# Patient Record
Sex: Male | Born: 1995 | Hispanic: Yes | Marital: Single | Smoking: Never smoker
Health system: Southern US, Community
[De-identification: ages and names within clinical notes are randomized; demographics above are authoritative.]

---

## 2018-05-25 ENCOUNTER — Encounter (HOSPITAL_COMMUNITY): Payer: Self-pay | Admitting: Emergency Medicine

## 2018-05-25 ENCOUNTER — Other Ambulatory Visit: Payer: Self-pay

## 2018-05-25 ENCOUNTER — Emergency Department (HOSPITAL_COMMUNITY)
Admission: EM | Admit: 2018-05-25 | Discharge: 2018-05-26 | Disposition: A | Discharge status: Home / Self Care | Payer: Medicaid Other | Attending: Emergency Medicine | Admitting: Emergency Medicine

## 2018-05-25 ENCOUNTER — Emergency Department (HOSPITAL_COMMUNITY): Payer: Medicaid Other

## 2018-05-25 DIAGNOSIS — M545 Low back pain: Secondary | ICD-10-CM | POA: Diagnosis not present

## 2018-05-25 DIAGNOSIS — H81399 Other peripheral vertigo, unspecified ear: Secondary | ICD-10-CM | POA: Diagnosis not present

## 2018-05-25 DIAGNOSIS — R42 Dizziness and giddiness: Secondary | ICD-10-CM | POA: Diagnosis present

## 2018-05-25 DIAGNOSIS — G8929 Other chronic pain: Secondary | ICD-10-CM

## 2018-05-25 LAB — URINALYSIS, ROUTINE W REFLEX MICROSCOPIC
Bilirubin Urine: NEGATIVE
Glucose, UA: NEGATIVE mg/dL
Hgb urine dipstick: NEGATIVE
Ketones, ur: NEGATIVE mg/dL
Leukocytes,Ua: NEGATIVE
Nitrite: NEGATIVE
Protein, ur: NEGATIVE mg/dL
Specific Gravity, Urine: 1.014 (ref 1.005–1.030)
pH: 7 (ref 5.0–8.0)

## 2018-05-25 MED ORDER — MECLIZINE HCL 12.5 MG PO TABS
25.0000 mg | ORAL_TABLET | Freq: Once | ORAL | Status: AC
  Administered 2018-05-25: 25 mg via ORAL
  Filled 2018-05-25: qty 2

## 2018-05-25 MED ORDER — MECLIZINE HCL 25 MG PO TABS: 25.0000 mg | ORAL_TABLET | Freq: Three times a day (TID) | ORAL | 0 refills | Status: AC | PRN

## 2018-05-25 NOTE — ED Triage Notes (Signed)
Headache since Thursday, dizzy  And back pain x 3 days.  Pain when starting to urinate.

## 2018-05-26 MED ORDER — IBUPROFEN 600 MG PO TABS: 600.0000 mg | ORAL_TABLET | Freq: Four times a day (QID) | ORAL | 0 refills | Status: AC | PRN

## 2018-05-26 NOTE — Discharge Instructions (Addendum)
You may take the medicine prescribed to help with your dizziness.  Refer to the instructions for the Epley maneuver which can help resolve this condition.  If your dizziness symptoms persist beyond the next week, I recommend calling an Ear, Nose and Throat specialist - in [place] that is Dr. Suszanne Conners.  The xray of your lower back is normal tonight.  You may benefit from an anti -inflammatory for back pain relief which you have been prescribed.

## 2018-05-27 NOTE — ED Provider Notes (Signed)
[facility] EMERGENCY DEPARTMENT Provider Note   CSN: [id number] Arrival date & time: 05/25/18  2059    History   Chief Complaint Chief Complaint  Patient presents with  . Dizziness    HPI Hector Yu is a 23 y.o. male presenting with several complaints, the primary being a symptom of dizziness, described as "the room spins", intermittently, and only when he stands up, not with other head positional changes for the past 5 days.  He denies fevers, chills, neck pain or stiffness, no n/v photophobia, vision changes or focal weakness but does endorse mild frontal headache. Appetite good.  Second complaint of acute on chronic low back pain, stating he has had back pain intermittently since he was hit in the lower back by a falling door at the age of 75.  He denies new injury but has had to sleep on the floor the past 3 nights to get relief.  He denies weakness or numbness in his extremities, no urinary retention or incontinence, no hematuria, no flank pain.  He reports chronic fleeting burning sensation at the start of his urine stream.  He denies penile dc or any risk for stds.   He has had no treatment for either of his complaints.     The history is provided by the patient. The history is limited by a language barrier. A language interpreter was used.    History reviewed. No pertinent past medical history.  There are no active problems to display for this patient.   History reviewed. No pertinent surgical history.      Home Medications    Prior to Admission medications   Medication Sig Start Date End Date Taking? Authorizing Provider  ibuprofen (ADVIL) 600 MG tablet Take 1 tablet (600 mg total) by mouth every 6 (six) hours as needed. 05/26/18   Burgess Amor, PA-C  meclizine (ANTIVERT) 25 MG tablet Take 1 tablet (25 mg total) by mouth 3 (three) times daily as needed for dizziness. 05/25/18   Burgess Amor, PA-C    Family History No family history on file.  Social History  Social History   Tobacco Use  . Smoking status: Never Smoker  . Smokeless tobacco: Never Used  Substance Use Topics  . Alcohol use: Not Currently  . Drug use: Never     Allergies   Patient has no known allergies.   Review of Systems Review of Systems  Constitutional: Negative.  Negative for chills and fever.  HENT: Negative.  Negative for congestion, ear discharge, ear pain, hearing loss, rhinorrhea and sinus pain.   Respiratory: Negative for shortness of breath.   Cardiovascular: Negative for chest pain and leg swelling.  Gastrointestinal: Negative for abdominal distention, abdominal pain, constipation and nausea.  Endocrine: Negative.   Genitourinary: Negative for difficulty urinating, dysuria, flank pain, frequency and urgency.  Musculoskeletal: Positive for back pain. Negative for gait problem, joint swelling, neck pain and neck stiffness.  Skin: Negative for rash.  Neurological: Positive for dizziness and headaches. Negative for weakness and numbness.     Physical Exam Updated Vital Signs BP 131/77 (BP Location: Left Arm)   Pulse 72   Temp 98.5 F (36.9 C) (Oral)   Resp 18   SpO2 100%   Physical Exam Vitals signs and nursing note reviewed.  Constitutional:      Appearance: He is well-developed.  HENT:     Head: Normocephalic and atraumatic.     Right Ear: Tympanic membrane and ear canal normal.     Left  Ear: Tympanic membrane and ear canal normal.     Nose: No congestion.  Eyes:     Conjunctiva/sclera: Conjunctivae normal.     Pupils: Pupils are equal, round, and reactive to light.  Neck:     Musculoskeletal: Normal range of motion and neck supple.  Cardiovascular:     Rate and Rhythm: Normal rate.     Heart sounds: Normal heart sounds.     Comments: Pedal pulses normal. Pulmonary:     Effort: Pulmonary effort is normal.  Abdominal:     General: Bowel sounds are normal. There is no distension.     Palpations: Abdomen is soft. There is no mass.      Tenderness: There is no abdominal tenderness.  Musculoskeletal: Normal range of motion.     Lumbar back: He exhibits tenderness. He exhibits no swelling, no edema and no spasm.     Comments: Midline lumbar ttp. No edema, no deformity.  No focal area of pain.  Lymphadenopathy:     Cervical: No cervical adenopathy.  Skin:    General: Skin is warm and dry.     Findings: No rash.  Neurological:     Mental Status: He is alert and oriented to person, place, and time.     GCS: GCS eye subscore is 4. GCS verbal subscore is 5. GCS motor subscore is 6.     Cranial Nerves: Cranial nerves are intact.     Sensory: Sensation is intact. No sensory deficit.     Motor: Motor function is intact. No tremor, atrophy or pronator drift.     Coordination: Coordination is intact.     Gait: Gait is intact. Gait normal.     Deep Tendon Reflexes:     Reflex Scores:      Patellar reflexes are 2+ on the right side and 2+ on the left side.      Achilles reflexes are 2+ on the right side and 2+ on the left side.    Comments: Normal heel-shin, normal rapid alternating movements. Cranial nerves III-XII intact.  No pronator drift.  Psychiatric:        Speech: Speech normal.        Behavior: Behavior normal.        Thought Content: Thought content normal.      ED Treatments / Results  Labs (all labs ordered are listed, but only abnormal results are displayed) Labs Reviewed  URINALYSIS, ROUTINE W REFLEX MICROSCOPIC  GC/CHLAMYDIA PROBE AMP ([place]) NOT AT [facility]    EKG None  Radiology Dg Lumbar Spine Complete  Result Date: 05/25/2018 CLINICAL DATA:  Back pain EXAM: LUMBAR SPINE - COMPLETE 4+ VIEW COMPARISON:  None. FINDINGS: There is no evidence of lumbar spine fracture. Alignment is normal. Intervertebral disc spaces are maintained. IMPRESSION: Negative. Electronically Signed   By: Charlett Nose M.D.   On: 05/25/2018 23:01    Procedures Procedures (including critical care time)  Medications Ordered in  ED Medications  meclizine (ANTIVERT) tablet 25 mg (25 mg Oral Given 05/25/18 2222)     Initial Impression / Assessment and Plan / ED Course  I have reviewed the triage vital signs and the nursing notes.  Pertinent labs & imaging results that were available during my care of the patient were reviewed by me and considered in my medical decision making (see chart for details).        Pt with sx suggesting peripheral vertigo, normal neuro exam and pt had moderate response to the meclizine  given here. No indication for advanced neuro imaging at this time.  Lumbar spine films negative. Given dysuria sx, gc/chlamydia cx added, pending. UA normal.  He was prescribed meclizine, encouraged anti inflammatories for low back pain prn.  Also encouraged obtaining pcp for ongoing care, referrals given.  Return precautions outlined.    The patient appears reasonably screened and/or stabilized for discharge and I doubt any other medical condition or other [facility] requiring further screening, evaluation, or treatment in the ED at this time prior to discharge.   Final Clinical Impressions(s) / ED Diagnoses   Final diagnoses:  Peripheral vertigo, unspecified laterality  Chronic midline low back pain without sciatica    ED Discharge Orders         Ordered    ibuprofen (ADVIL) 600 MG tablet  Every 6 hours PRN     05/26/18 0005    meclizine (ANTIVERT) 25 MG tablet  3 times daily PRN     05/25/18 2359           Burgess Amordol, Willeen Novak, PA-C 05/27/18 1336    Donnetta Hutchingook, Brian, MD 06/03/18 1510

## 2020-10-28 IMAGING — DX LUMBAR SPINE - COMPLETE 4+ VIEW
5 series · 5 of 5 positions shown · non-contrast
Comparison: None.

CLINICAL DATA: Back pain

EXAM:
LUMBAR SPINE - COMPLETE 4+ VIEW

[l-spine ap]
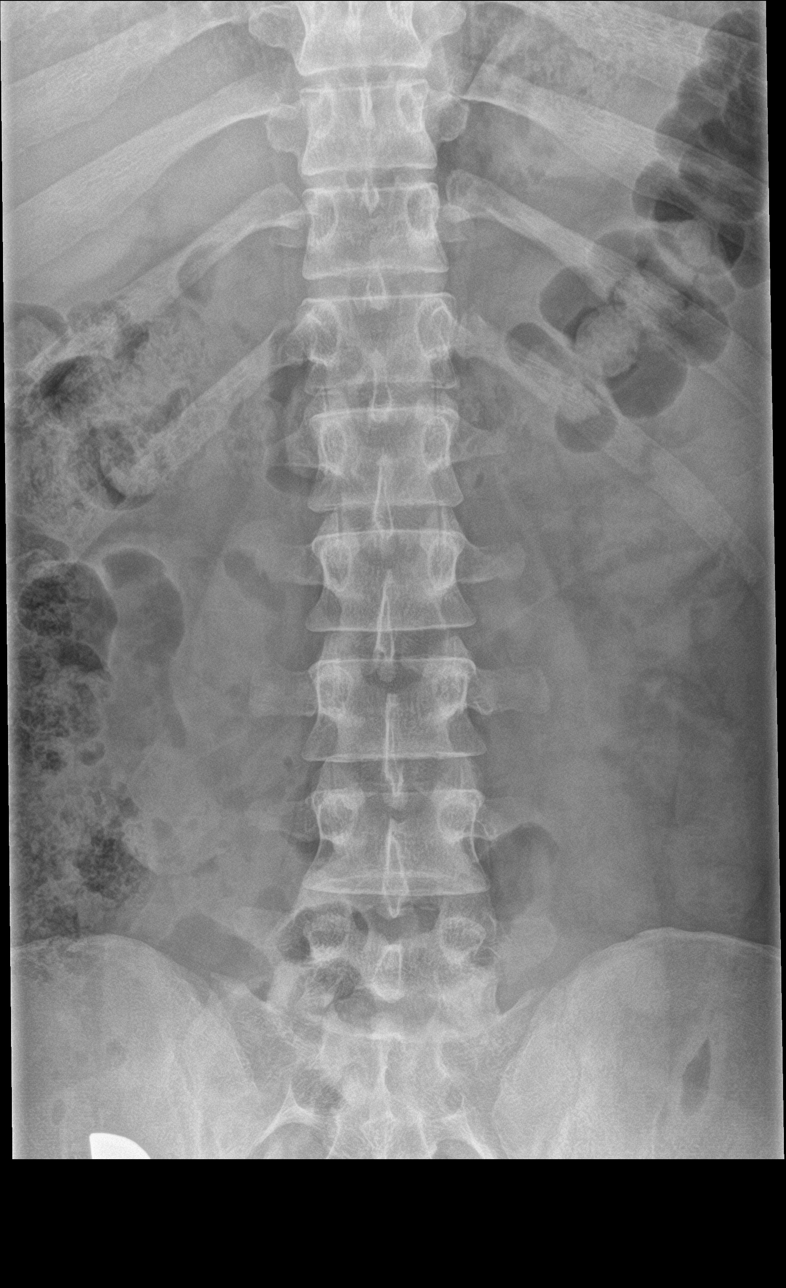

[l-spine obl (1 of 2)]
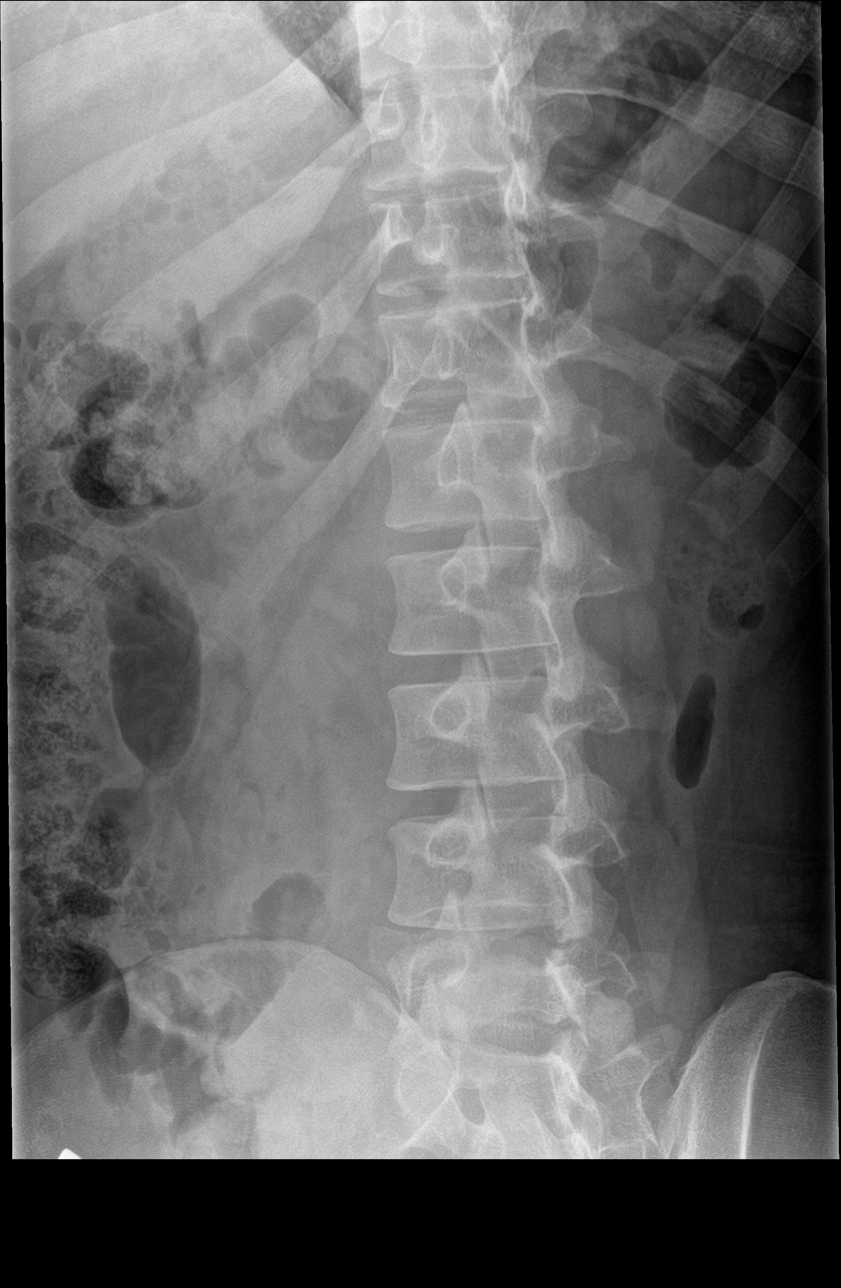

[l-spine obl (2 of 2)]
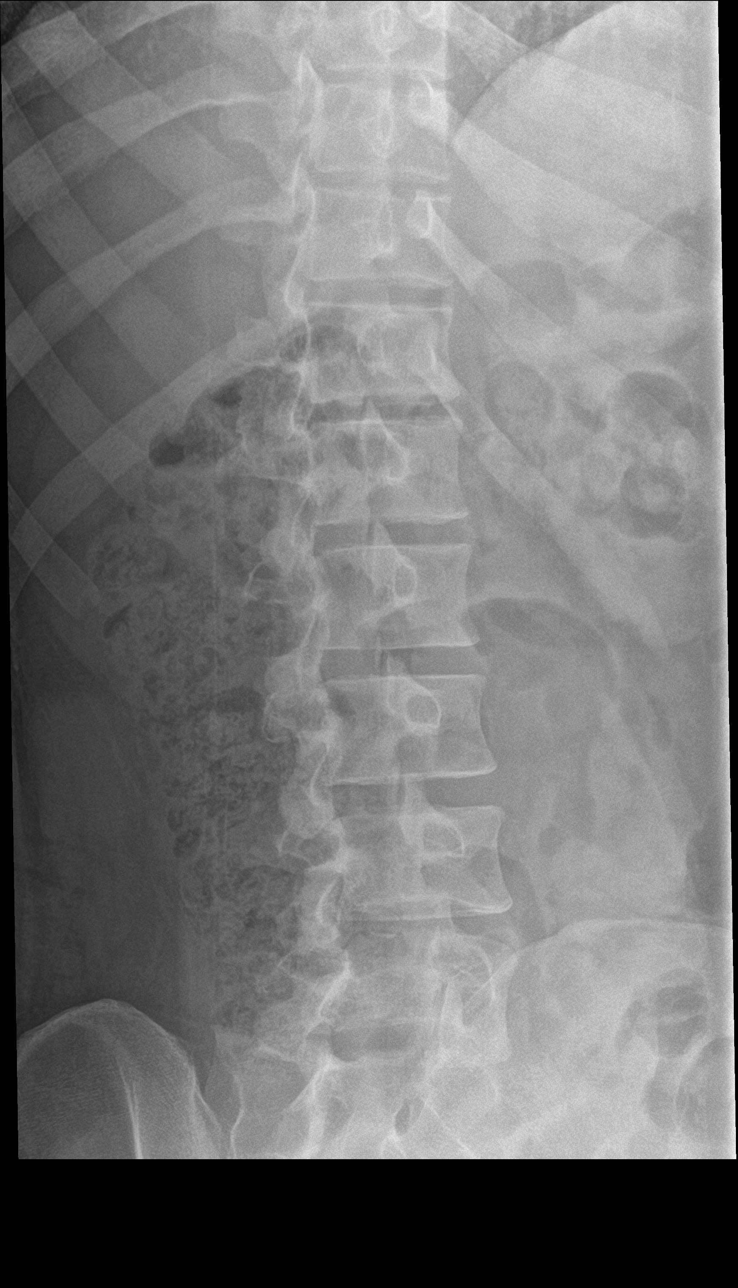

[l-spine lat]
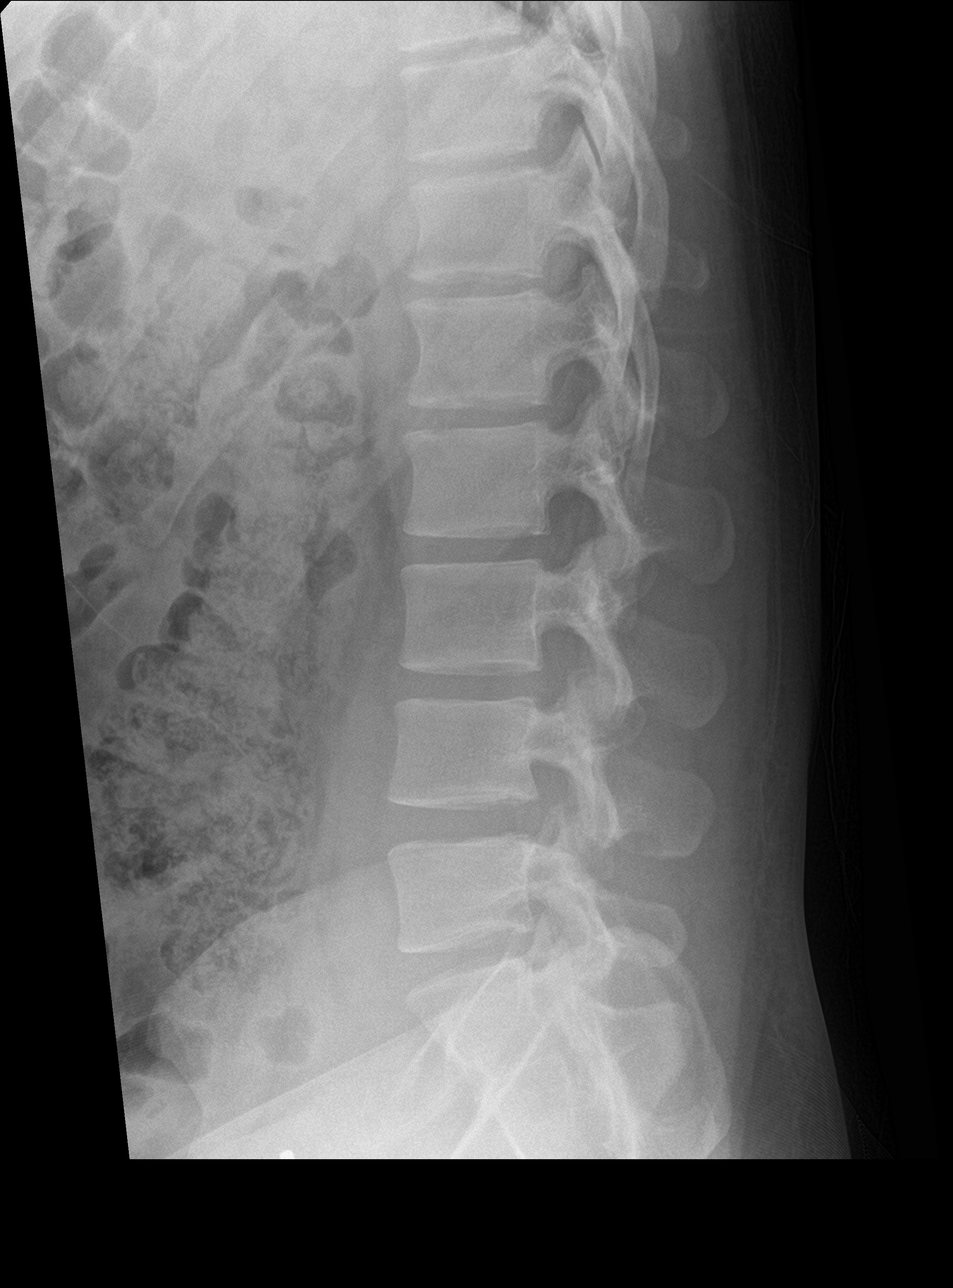

[l-spine spot]
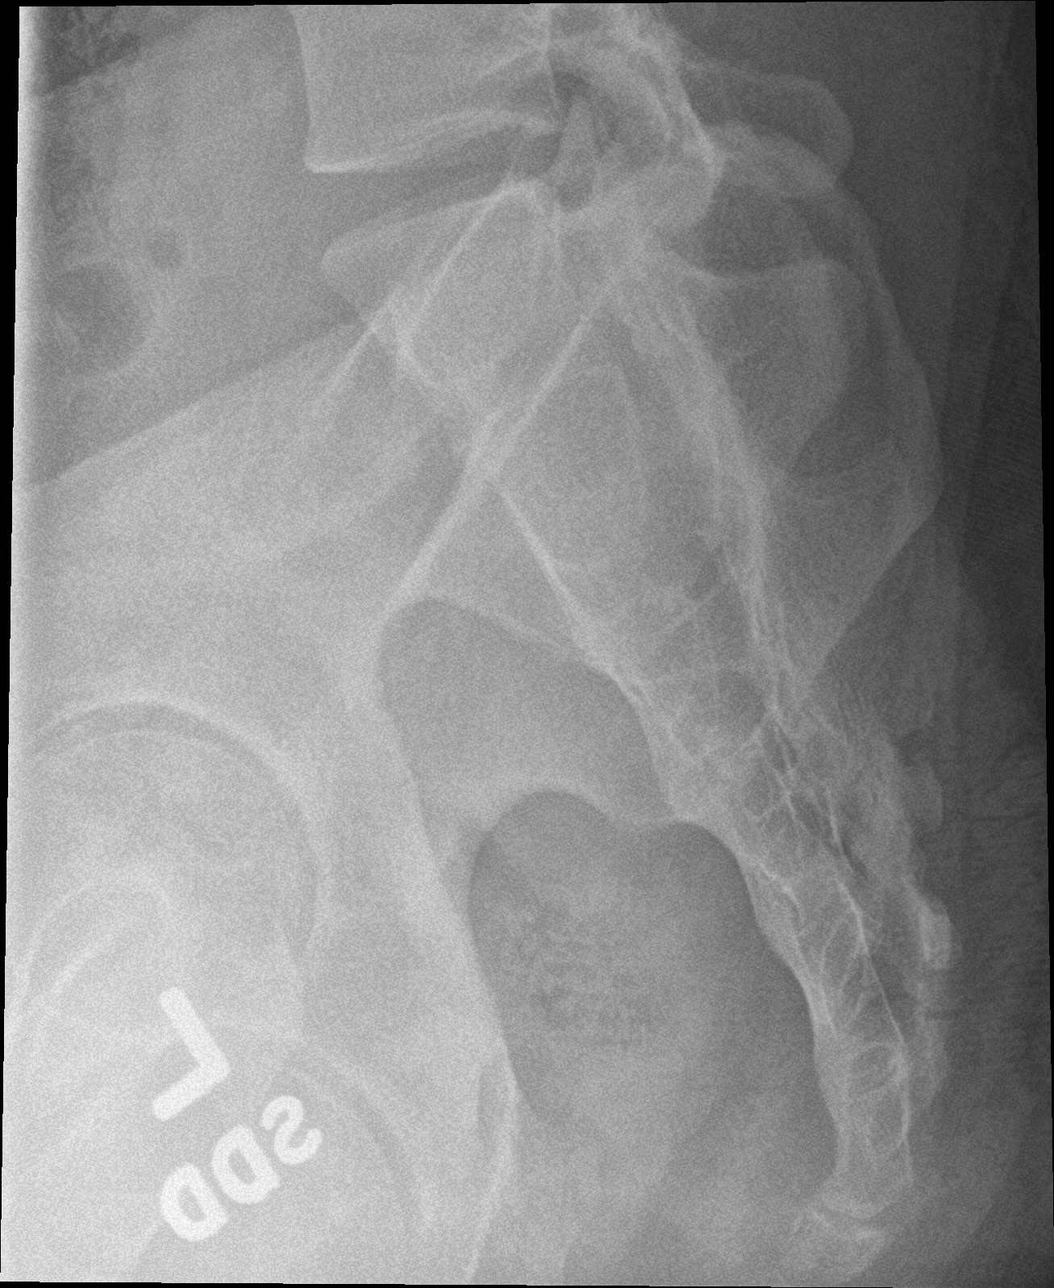

[5 of 5 positions shown; findings below may reference images not displayed]

FINDINGS: There is no evidence of lumbar spine fracture. Alignment is normal.
Intervertebral disc spaces are maintained.
IMPRESSION: Negative.

## 2023-05-18 ENCOUNTER — Other Ambulatory Visit: Payer: Self-pay

## 2023-05-18 ENCOUNTER — Emergency Department (HOSPITAL_COMMUNITY)
Admission: EM | Admit: 2023-05-18 | Discharge: 2023-05-18 | Disposition: A | Discharge status: Home / Self Care | Payer: Self-pay | Attending: Emergency Medicine | Admitting: Emergency Medicine

## 2023-05-18 DIAGNOSIS — R519 Headache, unspecified: Secondary | ICD-10-CM | POA: Insufficient documentation

## 2023-05-18 DIAGNOSIS — S0502XA Injury of conjunctiva and corneal abrasion without foreign body, left eye, initial encounter: Secondary | ICD-10-CM | POA: Insufficient documentation

## 2023-05-18 DIAGNOSIS — W228XXA Striking against or struck by other objects, initial encounter: Secondary | ICD-10-CM | POA: Insufficient documentation

## 2023-05-18 MED ORDER — ACETAMINOPHEN ER 650 MG PO TBCR: 650.0000 mg | EXTENDED_RELEASE_TABLET | Freq: Three times a day (TID) | ORAL | 0 refills | Status: AC | PRN

## 2023-05-18 MED ORDER — NAPROXEN 250 MG PO TABS
500.0000 mg | ORAL_TABLET | Freq: Once | ORAL | Status: AC
  Administered 2023-05-18: 500 mg via ORAL
  Filled 2023-05-18: qty 2

## 2023-05-18 MED ORDER — ERYTHROMYCIN 5 MG/GM OP OINT
TOPICAL_OINTMENT | Freq: Once | OPHTHALMIC | Status: AC
  Administered 2023-05-18: 1 via OPHTHALMIC
  Filled 2023-05-18: qty 3.5

## 2023-05-18 MED ORDER — NAPROXEN 500 MG PO TABS: 500.0000 mg | ORAL_TABLET | Freq: Two times a day (BID) | ORAL | 0 refills | Status: AC

## 2023-05-18 MED ORDER — FLUORESCEIN SODIUM 1 MG OP STRP
1.0000 | ORAL_STRIP | Freq: Once | OPHTHALMIC | Status: AC
  Administered 2023-05-18: 1 via OPHTHALMIC
  Filled 2023-05-18: qty 1

## 2023-05-18 MED ORDER — ACETAMINOPHEN 500 MG PO TABS
1000.0000 mg | ORAL_TABLET | Freq: Once | ORAL | Status: AC
  Administered 2023-05-18: 1000 mg via ORAL
  Filled 2023-05-18: qty 2

## 2023-05-18 MED ORDER — TETRACAINE HCL 0.5 % OP SOLN
2.0000 [drp] | Freq: Once | OPHTHALMIC | Status: AC
  Administered 2023-05-18: 2 [drp] via OPHTHALMIC
  Filled 2023-05-18: qty 4

## 2023-05-18 MED ORDER — ERYTHROMYCIN 5 MG/GM OP OINT: TOPICAL_OINTMENT | OPHTHALMIC | 0 refills | Status: AC

## 2023-05-18 NOTE — ED Provider Notes (Signed)
  [place] EMERGENCY DEPARTMENT AT [facility] Provider Note   CSN: [id number] Arrival date & time: 05/18/23  1027     History {Add pertinent medical, surgical, social history, OB history to HPI:1} Chief Complaint  Patient presents with   Foreign Body in Eye    Pecos Mimnaugh is a 28 y.o. male.  HPI     28 yo M comes in with cc of headache, eye discomfort, tearing, and scratching sensation.  Yday at 7 he was cleaning with a broom and accidentally struck the broom stick to his eye. He has some blurry vision. Headache - frontal, R side.   Home Medications Prior to Admission medications   Medication Sig Start Date End Date Taking? Authorizing Provider  ibuprofen  (ADVIL ) 600 MG tablet Take 1 tablet (600 mg total) by mouth every 6 (six) hours as needed. 05/26/18   Idol, Julie, PA-C  meclizine  (ANTIVERT ) 25 MG tablet Take 1 tablet (25 mg total) by mouth 3 (three) times daily as needed for dizziness. 05/25/18   Idol, Julie, PA-C      Allergies    Patient has no known allergies.    Review of Systems   Review of Systems  All other systems reviewed and are negative.   Physical Exam Updated Vital Signs BP 123/81   Pulse 80   Temp 98.3 F (36.8 C)   Resp 18   Ht 5\' 6"  (1.676 m)   Wt 99.8 kg   SpO2 97%   BMI 35.51 kg/m  Physical Exam Vitals and nursing note reviewed.  Constitutional:      Appearance: He is well-developed.  HENT:     Head: Atraumatic.  Cardiovascular:     Rate and Rhythm: Normal rate.  Pulmonary:     Effort: Pulmonary effort is normal.  Musculoskeletal:     Cervical back: Neck supple.  Skin:    General: Skin is warm.  Neurological:     Mental Status: He is alert and oriented to person, place, and time.     ED Results / Procedures / Treatments   Labs (all labs ordered are listed, but only abnormal results are displayed) Labs Reviewed - No data to display  EKG None  Radiology No results found.  Procedures Procedures   {Document cardiac monitor, telemetry assessment procedure when appropriate:1}  Medications Ordered in ED Medications  fluorescein ophthalmic strip 1 strip (has no administration in time range)  tetracaine (PONTOCAINE) 0.5 % ophthalmic solution 2 drop (has no administration in time range)    ED Course/ Medical Decision Making/ A&P   {   Click here for ABCD2, HEART and other calculatorsREFRESH Note before signing :1}                              Medical Decision Making Risk OTC drugs. Prescription drug management.   ***  {Document critical care time when appropriate:1} {Document review of labs and clinical decision tools ie heart score, Chads2Vasc2 etc:1}  {Document your independent review of radiology images, and any outside records:1} {Document your discussion with family members, caretakers, and with consultants:1} {Document social determinants of health affecting pt's care:1} {Document your decision making why or why not admission, treatments were needed:1} Final Clinical Impression(s) / ED Diagnoses Final diagnoses:  None    Rx / DC Orders ED Discharge Orders     None

## 2023-05-18 NOTE — ED Triage Notes (Signed)
 Patient coming from home for having pain in his eye since 5pm yesterday evening. Patient was sweeping his home and hit his eye with the broomstick handle.
